# Patient Record
Sex: Female | Born: 1965 | Hispanic: Yes | Smoking: Never smoker
Health system: Southern US, Community
[De-identification: ages and names within clinical notes are randomized; demographics above are authoritative.]

## PROBLEM LIST (undated history)

## (undated) DIAGNOSIS — R42 Dizziness and giddiness: Secondary | ICD-10-CM

## (undated) DIAGNOSIS — C801 Malignant (primary) neoplasm, unspecified: Secondary | ICD-10-CM

## (undated) DIAGNOSIS — E119 Type 2 diabetes mellitus without complications: Secondary | ICD-10-CM

## (undated) HISTORY — PX: BREAST LUMPECTOMY: SHX2

## (undated) HISTORY — PX: ABDOMINAL HYSTERECTOMY: SHX81

---

## 2003-11-12 ENCOUNTER — Other Ambulatory Visit: Payer: Self-pay

## 2004-07-13 ENCOUNTER — Emergency Department: Payer: Self-pay | Admitting: Emergency Medicine

## 2004-09-03 ENCOUNTER — Emergency Department: Payer: Self-pay | Admitting: General Practice

## 2004-09-05 ENCOUNTER — Emergency Department: Payer: Self-pay | Admitting: Emergency Medicine

## 2004-09-07 ENCOUNTER — Inpatient Hospital Stay: Payer: Self-pay | Admitting: Internal Medicine

## 2005-01-14 ENCOUNTER — Emergency Department: Payer: Self-pay | Admitting: Emergency Medicine

## 2005-03-18 ENCOUNTER — Emergency Department: Payer: Self-pay | Admitting: Emergency Medicine

## 2005-04-15 ENCOUNTER — Emergency Department: Payer: Self-pay | Admitting: General Practice

## 2005-05-26 ENCOUNTER — Emergency Department: Payer: Self-pay | Admitting: Emergency Medicine

## 2005-05-26 ENCOUNTER — Other Ambulatory Visit: Payer: Self-pay

## 2005-08-24 ENCOUNTER — Emergency Department: Payer: Self-pay | Admitting: Emergency Medicine

## 2006-02-25 ENCOUNTER — Emergency Department: Payer: Self-pay | Admitting: Emergency Medicine

## 2006-03-16 ENCOUNTER — Emergency Department: Payer: Self-pay | Admitting: Emergency Medicine

## 2006-04-25 ENCOUNTER — Ambulatory Visit: Payer: Self-pay | Admitting: Family Medicine

## 2007-04-29 ENCOUNTER — Ambulatory Visit: Payer: Self-pay | Admitting: Family Medicine

## 2007-05-02 ENCOUNTER — Ambulatory Visit: Payer: Self-pay | Admitting: Family Medicine

## 2007-05-30 ENCOUNTER — Ambulatory Visit: Payer: Self-pay | Admitting: Family Medicine

## 2007-11-17 ENCOUNTER — Ambulatory Visit: Payer: Self-pay | Admitting: Family Medicine

## 2008-03-22 ENCOUNTER — Emergency Department: Payer: Self-pay | Admitting: Emergency Medicine

## 2008-07-31 ENCOUNTER — Emergency Department: Payer: Self-pay | Admitting: Emergency Medicine

## 2008-12-15 ENCOUNTER — Emergency Department: Payer: Self-pay | Admitting: Emergency Medicine

## 2009-01-13 ENCOUNTER — Emergency Department: Payer: Self-pay | Admitting: Emergency Medicine

## 2009-03-04 ENCOUNTER — Emergency Department: Payer: Self-pay | Admitting: Emergency Medicine

## 2009-11-16 ENCOUNTER — Emergency Department: Payer: Self-pay | Admitting: Emergency Medicine

## 2009-12-15 ENCOUNTER — Emergency Department: Payer: Self-pay | Admitting: Emergency Medicine

## 2010-01-07 ENCOUNTER — Emergency Department: Payer: Self-pay | Admitting: Unknown Physician Specialty

## 2010-07-04 ENCOUNTER — Emergency Department: Payer: Self-pay | Admitting: Emergency Medicine

## 2010-08-20 ENCOUNTER — Emergency Department: Payer: Self-pay | Admitting: Emergency Medicine

## 2011-04-13 ENCOUNTER — Emergency Department: Payer: Self-pay | Admitting: Emergency Medicine

## 2011-10-11 ENCOUNTER — Ambulatory Visit: Payer: Self-pay | Admitting: Family Medicine

## 2012-06-19 ENCOUNTER — Encounter: Payer: Self-pay | Admitting: Internal Medicine

## 2012-06-20 ENCOUNTER — Encounter: Payer: Self-pay | Admitting: Internal Medicine

## 2012-08-14 ENCOUNTER — Emergency Department: Payer: Self-pay | Admitting: Emergency Medicine

## 2012-08-14 LAB — COMPREHENSIVE METABOLIC PANEL
Albumin: 3.5 g/dL (ref 3.4–5.0)
Alkaline Phosphatase: 150 U/L — ABNORMAL HIGH (ref 50–136)
BUN: 16 mg/dL (ref 7–18)
Bilirubin,Total: 0.2 mg/dL (ref 0.2–1.0)
Creatinine: 0.71 mg/dL (ref 0.60–1.30)
EGFR (African American): >60
EGFR (Non-African Amer.): >60
Glucose: 83 mg/dL (ref 65–99)
Potassium: 3.8 mmol/L (ref 3.5–5.1)
SGPT (ALT): 36 U/L (ref 12–78)
Sodium: 140 mmol/L (ref 136–145)
Total Protein: 8 g/dL (ref 6.4–8.2)

## 2012-08-14 LAB — URINALYSIS, COMPLETE
Blood: NEGATIVE
Glucose,UR: NEGATIVE mg/dL (ref 0–75)
Leukocyte Esterase: NEGATIVE
Nitrite: POSITIVE
Ph: 5 (ref 4.5–8.0)
Protein: NEGATIVE
Squamous Epithelial: 14
WBC UR: 3 /HPF (ref 0–5)

## 2012-08-14 LAB — CBC
HCT: 39.1 % (ref 35.0–47.0)
HGB: 12.9 g/dL (ref 12.0–16.0)
MCH: 26.3 pg (ref 26.0–34.0)
MCHC: 33 g/dL (ref 32.0–36.0)
MCV: 80 fL (ref 80–100)

## 2013-02-21 IMAGING — CT CT ABD-PELV W/ CM
1 of 3 series · 13 of 32 positions shown, 19 images · IV contrast (isovue)
Comparison: none

REASON FOR EXAM: (1) upper adominal pain - epigastric and to left; (2)
upper adominal pain - epig
COMMENTS:

PROCEDURE:     CT  - CT ABDOMEN / PELVIS  W  - August 15, 2012  [DATE]
RESULT:     Comparison:  08/21/2010, 12/15/2009
TECHNIQUE: Multiple axial images of the abdomen and pelvis were performed
from the lung bases to the pubic symphysis, with p.o. contrast and with 100
mL of Isovue 300 intravenous contrast.

[Series 2: 3mm soft tissue · axial · 0.73mm/px · z∈[-752,-374]mm · 13 of 146 slices shown, 19 images]
[im 10/146  soft-tissue]
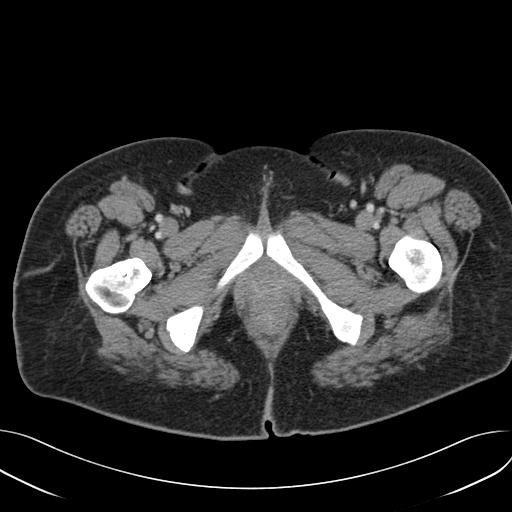
[im 10/146  bone]
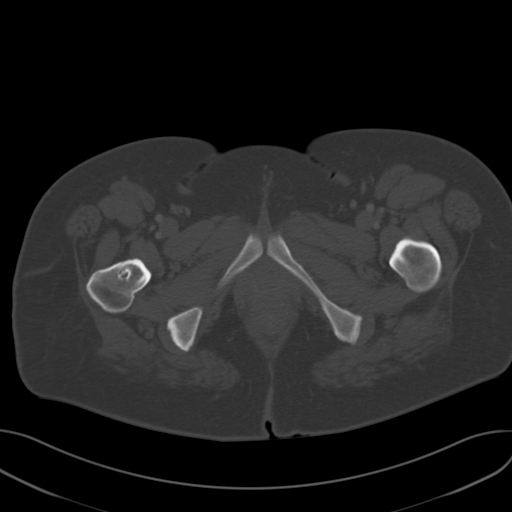
[im 20/146  soft-tissue]
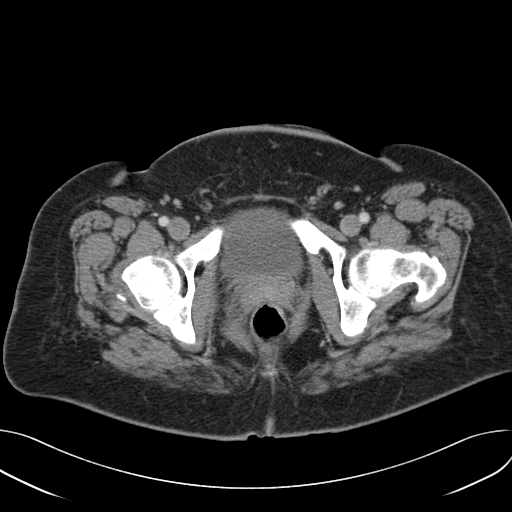
[im 30/146  soft-tissue]
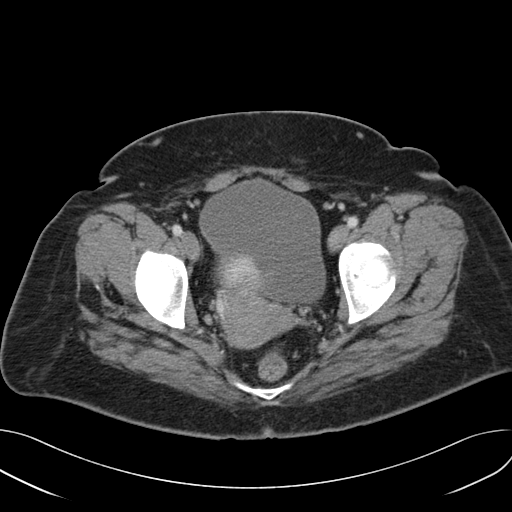
[im 39/146  soft-tissue]
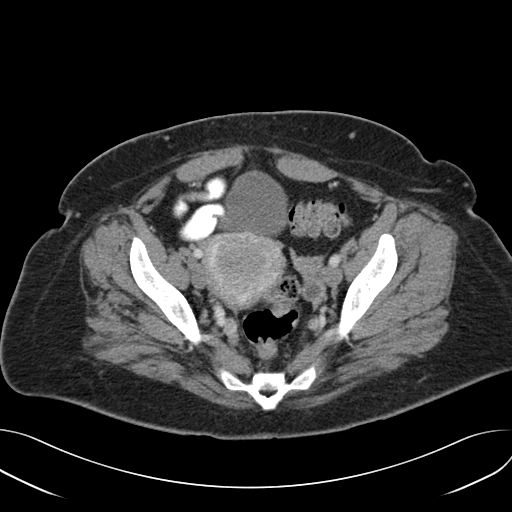
[im 49/146  soft-tissue]
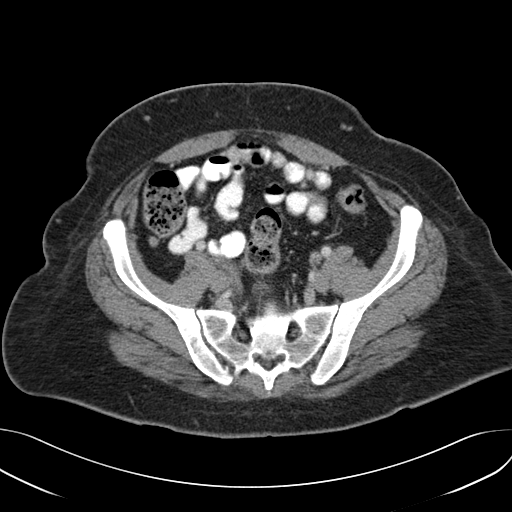
[im 59/146  soft-tissue]
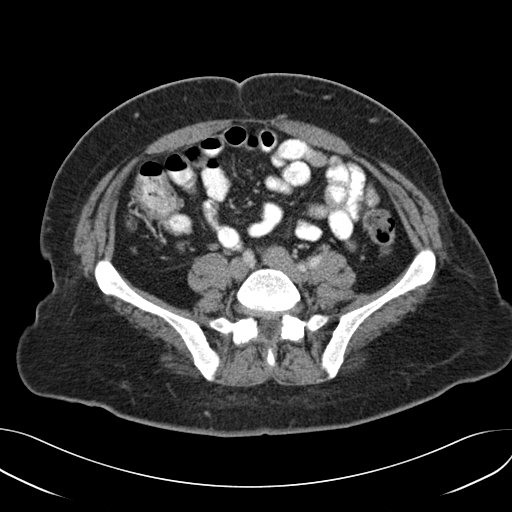
[im 78/146  soft-tissue]
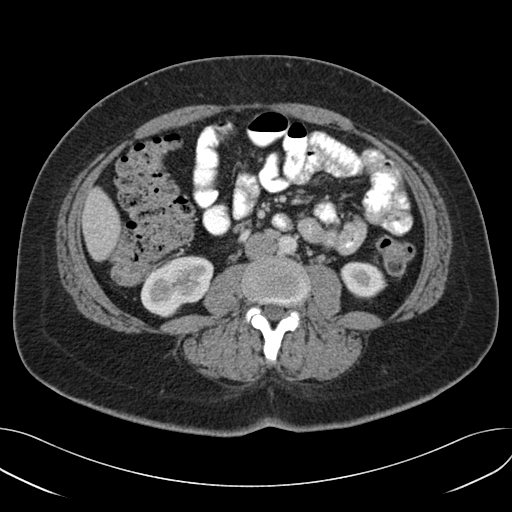
[im 88/146  soft-tissue]
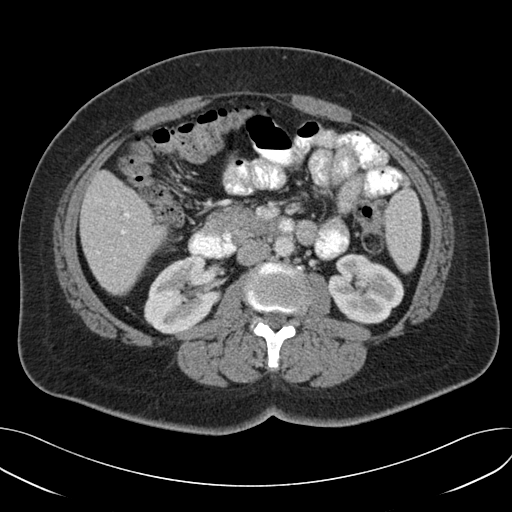
[im 97/146  soft-tissue]
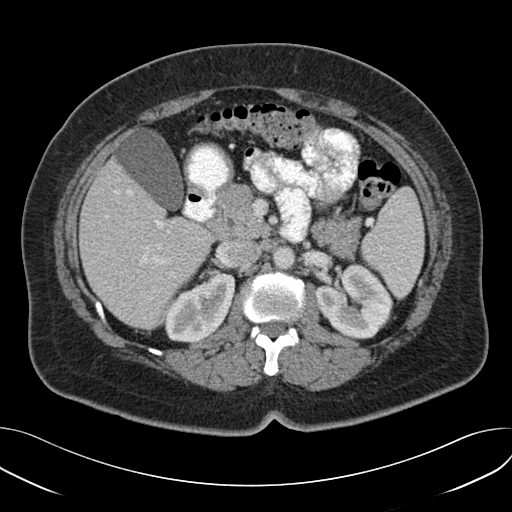
[im 97/146  bone]
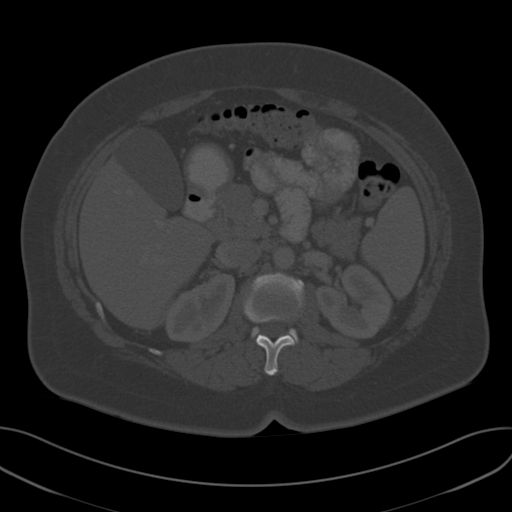
[im 107/146  soft-tissue]
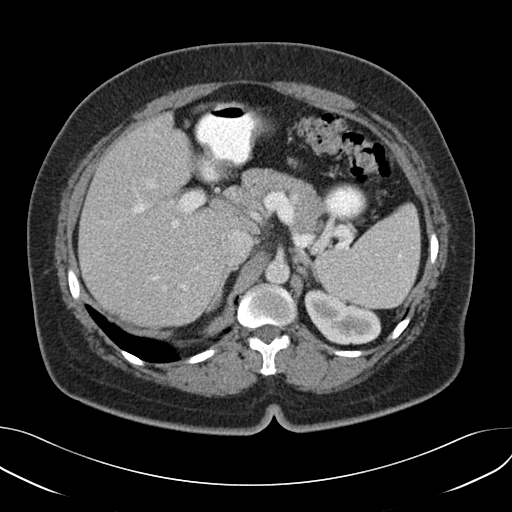
[im 107/146  lung]
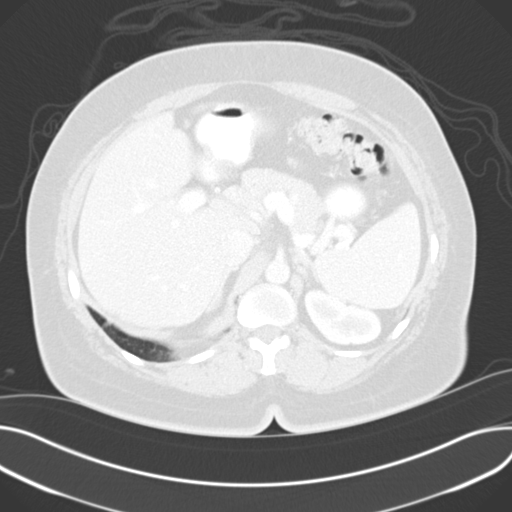
[im 117/146  soft-tissue]
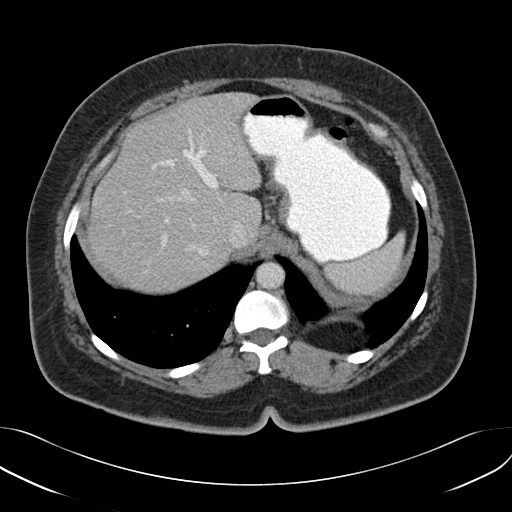
[im 117/146  lung]
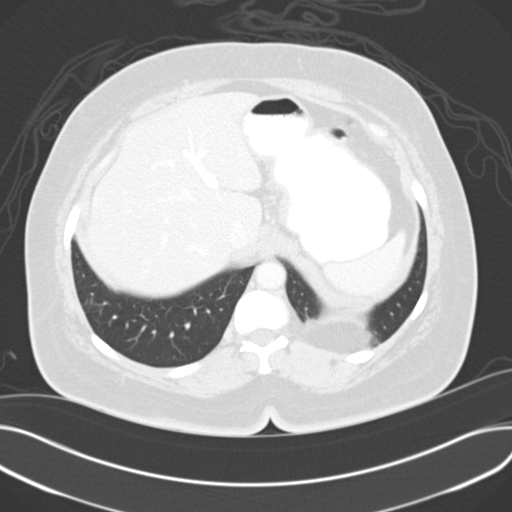
[im 126/146  soft-tissue]
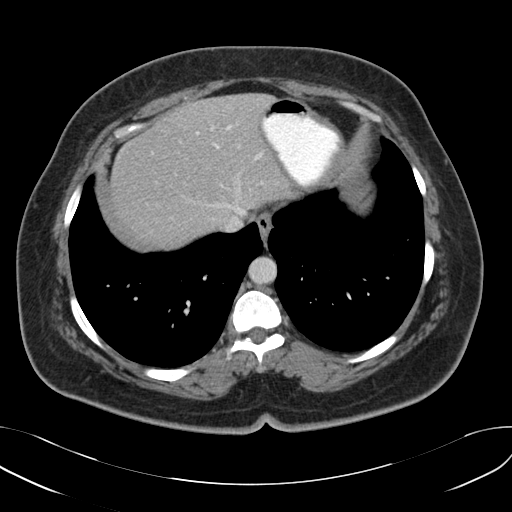
[im 126/146  lung]
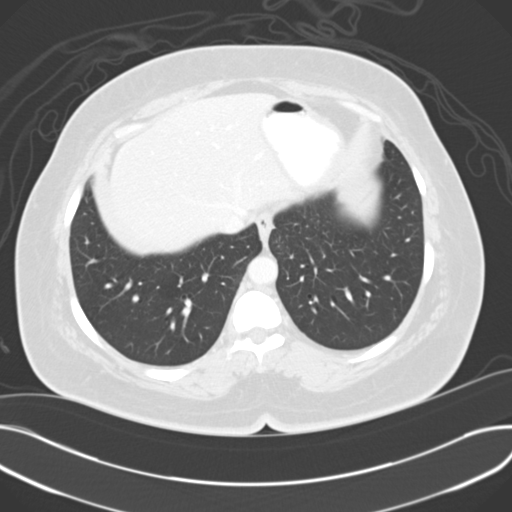
[im 136/146  soft-tissue]
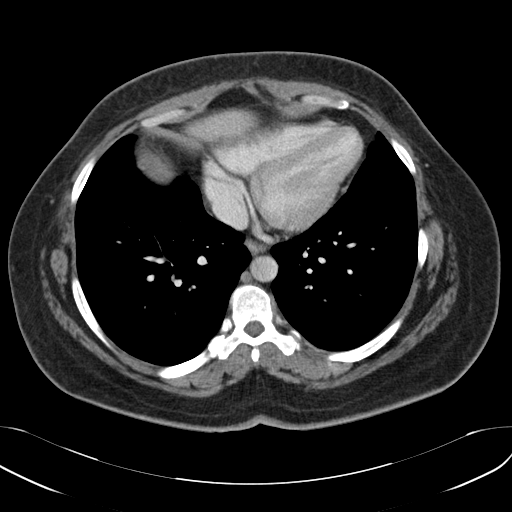
[im 136/146  lung]
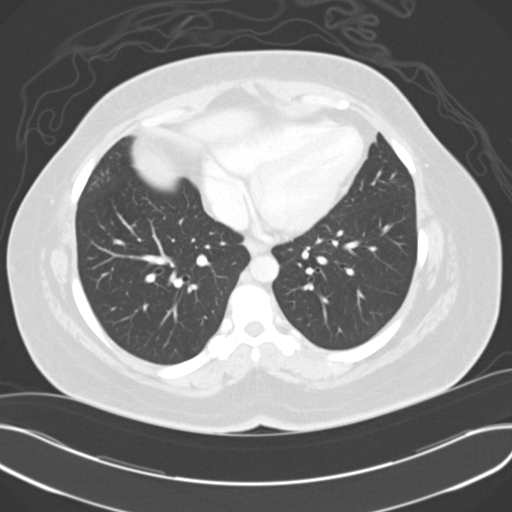

[13 of 32 positions shown; findings below may reference images not displayed]

FINDINGS: 4 mm nodule in the right lower lobe is similar to prior studies given
differences in slice selection.

The liver, gallbladder, spleen, adrenals, and pancreas are unremarkable. The
kidneys enhance normally. No hydronephrosis. Small low-attenuation focus in
the right kidney is too small to characterize.

The small and large bowel are normal in caliber. There is a small
fat-containing periumbilical hernia. The appendix is normal. There is a
cm cyst in the left ovary. There is mild heterogeneous enhancement of the
uterus, which is nonspecific.

Cylindrical lucency in the proximal right femur is likely related to prior
intramedullary rod.
IMPRESSION: No acute findings in the abdomen or pelvis.

## 2018-06-22 ENCOUNTER — Encounter: Payer: Self-pay | Admitting: Emergency Medicine

## 2018-06-22 ENCOUNTER — Emergency Department
Admission: EM | Admit: 2018-06-22 | Discharge: 2018-06-22 | Disposition: A | Discharge status: Home / Self Care | Payer: Self-pay | Attending: Emergency Medicine | Admitting: Emergency Medicine

## 2018-06-22 DIAGNOSIS — Z853 Personal history of malignant neoplasm of breast: Secondary | ICD-10-CM | POA: Insufficient documentation

## 2018-06-22 DIAGNOSIS — R42 Dizziness and giddiness: Secondary | ICD-10-CM

## 2018-06-22 DIAGNOSIS — E119 Type 2 diabetes mellitus without complications: Secondary | ICD-10-CM | POA: Insufficient documentation

## 2018-06-22 DIAGNOSIS — R55 Syncope and collapse: Secondary | ICD-10-CM

## 2018-06-22 HISTORY — DX: Type 2 diabetes mellitus without complications: E11.9

## 2018-06-22 HISTORY — DX: Dizziness and giddiness: R42

## 2018-06-22 HISTORY — DX: Malignant (primary) neoplasm, unspecified: C80.1

## 2018-06-22 LAB — BASIC METABOLIC PANEL
Anion gap: 10 (ref 5–15)
BUN: 16 mg/dL (ref 6–20)
CHLORIDE: 109 mmol/L (ref 98–111)
CO2: 21 mmol/L — ABNORMAL LOW (ref 22–32)
Calcium: 8.9 mg/dL (ref 8.9–10.3)
Creatinine, Ser: 0.59 mg/dL (ref 0.44–1.00)
GFR calc non Af Amer: >60 mL/min (ref >60)
Glucose, Bld: 143 mg/dL — ABNORMAL HIGH (ref 70–99)
POTASSIUM: 3.9 mmol/L (ref 3.5–5.1)
SODIUM: 140 mmol/L (ref 135–145)

## 2018-06-22 LAB — CBC
HEMATOCRIT: 37.6 % (ref 36.0–46.0)
HEMOGLOBIN: 12.8 g/dL (ref 12.0–15.0)
MCH: 28.5 pg (ref 26.0–34.0)
MCHC: 34 g/dL (ref 30.0–36.0)
MCV: 83.7 fL (ref 80.0–100.0)
NRBC: 0 % (ref 0.0–0.2)
Platelets: 206 10*3/uL (ref 150–400)
RBC: 4.49 MIL/uL (ref 3.87–5.11)
RDW: 12.6 % (ref 11.5–15.5)
WBC: 7.5 10*3/uL (ref 4.0–10.5)

## 2018-06-22 LAB — HEPATIC FUNCTION PANEL
ALBUMIN: 3.7 g/dL (ref 3.5–5.0)
ALT: 17 U/L (ref 0–44)
AST: 28 U/L (ref 15–41)
Alkaline Phosphatase: 74 U/L (ref 38–126)
BILIRUBIN TOTAL: 0.7 mg/dL (ref 0.3–1.2)
Bilirubin, Direct: 0.2 mg/dL (ref 0.0–0.2)
Indirect Bilirubin: 0.5 mg/dL (ref 0.3–0.9)
Total Protein: 7 g/dL (ref 6.5–8.1)

## 2018-06-22 LAB — LIPASE, BLOOD: Lipase: 40 U/L (ref 11–51)

## 2018-06-22 NOTE — ED Notes (Signed)
Reviewed discharge instructions and follow-up care with patient. Patient verbalized understanding of all information reviewed. Patient stable, with no distress noted at this time.    

## 2018-06-22 NOTE — Discharge Instructions (Signed)
Fortunately today your EKG and your blood work were very reassuring.  Please make sure you remain well-hydrated and follow-up in clinic this coming week for recheck.  Return to the emergency department sooner for any concerns.  It was a pleasure to take care of you today, and thank you for coming to our emergency department.  If you have any questions or concerns before leaving please ask the nurse to grab me and I'm more than happy to go through your aftercare instructions again.  If you were prescribed any opioid pain medication today such as Norco, Vicodin, Percocet, morphine, hydrocodone, or oxycodone please make sure you do not drive when you are taking this medication as it can alter your ability to drive safely.  If you have any concerns once you are home that you are not improving or are in fact getting worse before you can make it to your follow-up appointment, please do not hesitate to call 911 and come back for further evaluation.  Darel Hong, MD  Results for orders placed or performed during the hospital encounter of 54/27/06  Basic metabolic panel  Result Value Ref Range   Sodium 140 135 - 145 mmol/L   Potassium 3.9 3.5 - 5.1 mmol/L   Chloride 109 98 - 111 mmol/L   CO2 21 (L) 22 - 32 mmol/L   Glucose, Bld 143 (H) 70 - 99 mg/dL   BUN 16 6 - 20 mg/dL   Creatinine, Ser 0.59 0.44 - 1.00 mg/dL   Calcium 8.9 8.9 - 10.3 mg/dL   GFR calc non Af Amer >60 >60 mL/min   GFR calc Af Amer >60 >60 mL/min   Anion gap 10 5 - 15  CBC  Result Value Ref Range   WBC 7.5 4.0 - 10.5 K/uL   RBC 4.49 3.87 - 5.11 MIL/uL   Hemoglobin 12.8 12.0 - 15.0 g/dL   HCT 37.6 36.0 - 46.0 %   MCV 83.7 80.0 - 100.0 fL   MCH 28.5 26.0 - 34.0 pg   MCHC 34.0 30.0 - 36.0 g/dL   RDW 12.6 11.5 - 15.5 %   Platelets 206 150 - 400 K/uL   nRBC 0.0 0.0 - 0.2 %  Hepatic function panel  Result Value Ref Range   Total Protein 7.0 6.5 - 8.1 g/dL   Albumin 3.7 3.5 - 5.0 g/dL   AST 28 15 - 41 U/L   ALT 17 0 - 44  U/L   Alkaline Phosphatase 74 38 - 126 U/L   Total Bilirubin 0.7 0.3 - 1.2 mg/dL   Bilirubin, Direct 0.2 0.0 - 0.2 mg/dL   Indirect Bilirubin 0.5 0.3 - 0.9 mg/dL  Lipase, blood  Result Value Ref Range   Lipase 40 11 - 51 U/L

## 2018-06-22 NOTE — ED Notes (Signed)
Registration at bedside.

## 2018-06-22 NOTE — ED Provider Notes (Signed)
[facility] Emergency Department Provider Note  ____________________________________________   First MD Initiated Contact with Patient 06/22/18 [phone]     (approximate)  I have reviewed the triage vital signs and the nursing notes.   HISTORY  Chief Complaint Loss of Consciousness   HPI Sheila Fischer is a 52 y.o. female comes to the emergency department by EMS after a syncopal episode.  She said she got up from sleep and felt dizzy and lightheaded and nauseated and then went to the bathroom where she vomited several times and then passed out and woke up on the ground.  Her friend called 911.  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  Her dizziness is completely resolved at this point.  She never had any numbness or weakness.  No double vision or blurred vision.  No tinnitus.  No head trauma.  She does still feels somewhat nauseated although no longer dizzy whatsoever.  She is never passed out before.  She had no antecedent chest pain or palpitations.  No family history of sudden cardiac death.   She takes no nodal blocking agents.  Her symptoms came on suddenly were severe worsened by vomiting and improved with rest.   Past Medical History:  Diagnosis Date  . Cancer [facility])    breast cancer  . Diabetes mellitus without complication ([place])   . Vertigo     There are no active problems to display for this patient.   Past Surgical History:  Procedure Laterality Date  . ABDOMINAL HYSTERECTOMY    . BREAST LUMPECTOMY      Prior to Admission medications   Not on File    Allergies Patient has no known allergies.  No family history on file.  Social History Social History   Tobacco Use  . Smoking status: Never Smoker  . Smokeless tobacco: Never Used  Substance Use Topics  . Alcohol use: Not Currently  . Drug use: Never    Review of Systems Constitutional: No fever/chills Eyes: No visual changes. ENT: No sore throat. Cardiovascular: Denies  chest pain. Respiratory: Denies shortness of breath. Gastrointestinal: Positive for abdominal pain.  Positive for nausea, positive for vomiting.  No diarrhea.  No constipation. Genitourinary: Negative for dysuria. Musculoskeletal: Negative for back pain. Skin: Negative for rash. Neurological: Negative for headaches, focal weakness or numbness.   ____________________________________________   PHYSICAL EXAM:  VITAL SIGNS: ED Triage Vitals  Enc Vitals Group     BP 06/22/18 0407 (!) 158/100     Pulse Rate 06/22/18 0407 82     Resp 06/22/18 0407 18     Temp 06/22/18 0407 97.8 F (36.6 C)     Temp Source 06/22/18 0407 Oral     SpO2 06/22/18 0407 97 %     Weight 06/22/18 0404 187 lb (84.8 kg)     Height 06/22/18 0404 5' (1.524 m)     Head Circumference --      Peak Flow --      Pain Score 06/22/18 0404 0     Pain Loc --      Pain Edu? --      Excl. in [place]? --     Constitutional: Alert and oriented x4 pleasant cooperative speaks in full clear sentences no diaphoresis Eyes: PERRL EOMI. right word lateral reproducible fatigable short-lived nystagmus no vertical or rotary nystagmus Head: Atraumatic. Nose: No congestion/rhinnorhea. Mouth/Throat: No trismus Neck: No stridor.   Cardiovascular: Normal rate, regular rhythm. Grossly normal heart sounds.  Good  peripheral circulation. Respiratory: Normal respiratory effort.  No retractions. Lungs CTAB and moving good air Gastrointestinal: Nondistended nontender no rebound or guarding no peritonitis Musculoskeletal: No lower extremity edema   Neurologic:  Normal speech and language. No gross focal neurologic deficits are appreciated. Normal finger-nose-finger ambulates with steady gait no truncal ataxia Skin:  Skin is warm, dry and intact. No rash noted. Psychiatric: Mood and affect are normal. Speech and behavior are normal.    ____________________________________________   DIFFERENTIAL includes but not limited to  Peripheral  vertigo, central vertigo, dehydration, lightheadedness, vasovagal syncope, cardiogenic syncope ____________________________________________   LABS (all labs ordered are listed, but only abnormal results are displayed)  Labs Reviewed  BASIC METABOLIC PANEL - Abnormal; Notable for the following components:      Result Value   CO2 21 (*)    Glucose, Bld 143 (*)    All other components within normal limits  CBC  HEPATIC FUNCTION PANEL  LIPASE, BLOOD    Lab work reviewed by me with no acute disease __________________________________________  EKG  ED ECG REPORT I, Darel Hong, the attending physician, personally viewed and interpreted this ECG.  Date: 06/22/2018 EKG Time:  Rate: 81 Rhythm: normal sinus rhythm QRS Axis: normal Intervals: normal ST/T Wave abnormalities: normal Narrative Interpretation: no evidence of acute ischemia  ____________________________________________  RADIOLOGY   ____________________________________________   PROCEDURES  Procedure(s) performed: no  Procedures  Critical Care performed: no  ____________________________________________   INITIAL IMPRESSION / ASSESSMENT AND PLAN / ED COURSE  Pertinent labs & imaging results that were available during my care of the patient were reviewed by me and considered in my medical decision making (see chart for details).   As part of my medical decision making, I reviewed the following data within the [place] History obtained from family if available, nursing notes, old chart and ekg, as well as notes from prior ED visits.  The time I saw the patient her symptoms were nearly completely resolved.  She does appear to have some degree of peripheral vertigo on the right.  We discussed that retching and heaving can cause a transient elevation of the blood pressure carotids need to vasovagal syncope.  She feels improved and would like to go home which I think is reasonable.  She is On  lowers no ectopy and EKG with no concerning signs for ventricular dysrhythmia.  She is discharged home with primary care follow-up verbalizes understanding agreement with plan.      ____________________________________________   FINAL CLINICAL IMPRESSION(S) / ED DIAGNOSES  Final diagnoses:  Vasovagal syncope  Dizziness      NEW MEDICATIONS STARTED DURING THIS VISIT:  There are no discharge medications for this patient.    Note:  This document was prepared using Dragon voice recognition software and may include unintentional dictation errors.     Darel Hong, MD 06/22/18 [phone]

## 2018-06-22 NOTE — ED Triage Notes (Signed)
Patient coming [facility] from a friends home for syncopal episode in the bathroom. Patient hyperventilating on bathroom floor at EMS arrival. Patient dry heaving at arrival to this ED.   Patient reports hx diabetes and vertigo.   EMS vitals: CBP 150, BP 160-94, HR 87, 100% on RA.
